# Patient Record
Sex: Female | Born: 1975 | Hispanic: No | Marital: Single | Smoking: Never smoker
Health system: Southern US, Community
[De-identification: ages and names within clinical notes are randomized; demographics above are authoritative.]

## PROBLEM LIST (undated history)

## (undated) DIAGNOSIS — T7840XA Allergy, unspecified, initial encounter: Secondary | ICD-10-CM

## (undated) HISTORY — DX: Allergy, unspecified, initial encounter: T78.40XA

---

## 2014-09-19 ENCOUNTER — Ambulatory Visit
Admission: RE | Admit: 2014-09-19 | Discharge: 2014-09-19 | Disposition: A | Discharge status: Home / Self Care | Payer: BC Managed Care – PPO | Source: Ambulatory Visit | Attending: Family Medicine | Admitting: Family Medicine

## 2014-09-19 ENCOUNTER — Other Ambulatory Visit: Payer: Self-pay | Admitting: Family Medicine

## 2014-09-19 DIAGNOSIS — R059 Cough, unspecified: Secondary | ICD-10-CM

## 2014-09-19 DIAGNOSIS — R05 Cough: Secondary | ICD-10-CM

## 2014-12-04 ENCOUNTER — Other Ambulatory Visit: Payer: Self-pay | Admitting: Family Medicine

## 2014-12-04 ENCOUNTER — Ambulatory Visit
Admission: RE | Admit: 2014-12-04 | Discharge: 2014-12-04 | Disposition: A | Discharge status: Home / Self Care | Payer: BLUE CROSS/BLUE SHIELD | Source: Ambulatory Visit | Attending: Family Medicine | Admitting: Family Medicine

## 2014-12-04 DIAGNOSIS — M542 Cervicalgia: Secondary | ICD-10-CM

## 2014-12-05 ENCOUNTER — Ambulatory Visit: Payer: BLUE CROSS/BLUE SHIELD | Attending: Family Medicine

## 2014-12-05 DIAGNOSIS — S199XXD Unspecified injury of neck, subsequent encounter: Secondary | ICD-10-CM | POA: Insufficient documentation

## 2014-12-05 DIAGNOSIS — M542 Cervicalgia: Secondary | ICD-10-CM | POA: Diagnosis not present

## 2014-12-05 DIAGNOSIS — S4992XD Unspecified injury of left shoulder and upper arm, subsequent encounter: Secondary | ICD-10-CM | POA: Insufficient documentation

## 2014-12-05 DIAGNOSIS — M25511 Pain in right shoulder: Secondary | ICD-10-CM | POA: Diagnosis not present

## 2014-12-05 DIAGNOSIS — R5381 Other malaise: Secondary | ICD-10-CM | POA: Diagnosis not present

## 2014-12-05 DIAGNOSIS — S299XXD Unspecified injury of thorax, subsequent encounter: Secondary | ICD-10-CM | POA: Diagnosis not present

## 2014-12-05 DIAGNOSIS — S4991XD Unspecified injury of right shoulder and upper arm, subsequent encounter: Secondary | ICD-10-CM | POA: Diagnosis not present

## 2014-12-05 DIAGNOSIS — M25512 Pain in left shoulder: Secondary | ICD-10-CM | POA: Diagnosis not present

## 2014-12-10 ENCOUNTER — Ambulatory Visit: Payer: BLUE CROSS/BLUE SHIELD | Admitting: Physical Therapy

## 2014-12-12 ENCOUNTER — Other Ambulatory Visit (HOSPITAL_COMMUNITY)
Admission: RE | Admit: 2014-12-12 | Discharge: 2014-12-12 | Disposition: A | Discharge status: Home / Self Care | Payer: BLUE CROSS/BLUE SHIELD | Source: Ambulatory Visit | Attending: Obstetrics and Gynecology | Admitting: Obstetrics and Gynecology

## 2014-12-12 ENCOUNTER — Other Ambulatory Visit: Payer: Self-pay | Admitting: Obstetrics and Gynecology

## 2014-12-12 DIAGNOSIS — Z1151 Encounter for screening for human papillomavirus (HPV): Secondary | ICD-10-CM | POA: Insufficient documentation

## 2014-12-12 DIAGNOSIS — Z01419 Encounter for gynecological examination (general) (routine) without abnormal findings: Secondary | ICD-10-CM | POA: Insufficient documentation

## 2014-12-13 ENCOUNTER — Ambulatory Visit: Payer: BLUE CROSS/BLUE SHIELD | Admitting: Physical Therapy

## 2014-12-13 DIAGNOSIS — S199XXD Unspecified injury of neck, subsequent encounter: Secondary | ICD-10-CM | POA: Diagnosis not present

## 2014-12-13 LAB — CYTOLOGY - PAP

## 2014-12-17 ENCOUNTER — Ambulatory Visit: Payer: BLUE CROSS/BLUE SHIELD | Admitting: Physical Therapy

## 2014-12-17 DIAGNOSIS — S199XXD Unspecified injury of neck, subsequent encounter: Secondary | ICD-10-CM | POA: Diagnosis not present

## 2014-12-19 ENCOUNTER — Ambulatory Visit: Payer: BLUE CROSS/BLUE SHIELD | Admitting: Physical Therapy

## 2014-12-19 DIAGNOSIS — S199XXD Unspecified injury of neck, subsequent encounter: Secondary | ICD-10-CM | POA: Diagnosis not present

## 2014-12-24 ENCOUNTER — Ambulatory Visit: Payer: BLUE CROSS/BLUE SHIELD | Attending: Family Medicine

## 2014-12-24 DIAGNOSIS — M25512 Pain in left shoulder: Secondary | ICD-10-CM | POA: Insufficient documentation

## 2014-12-24 DIAGNOSIS — S4991XD Unspecified injury of right shoulder and upper arm, subsequent encounter: Secondary | ICD-10-CM | POA: Diagnosis not present

## 2014-12-24 DIAGNOSIS — R5381 Other malaise: Secondary | ICD-10-CM | POA: Diagnosis not present

## 2014-12-24 DIAGNOSIS — S299XXD Unspecified injury of thorax, subsequent encounter: Secondary | ICD-10-CM | POA: Diagnosis not present

## 2014-12-24 DIAGNOSIS — M25511 Pain in right shoulder: Secondary | ICD-10-CM | POA: Insufficient documentation

## 2014-12-24 DIAGNOSIS — S4992XD Unspecified injury of left shoulder and upper arm, subsequent encounter: Secondary | ICD-10-CM | POA: Insufficient documentation

## 2014-12-24 DIAGNOSIS — S199XXD Unspecified injury of neck, subsequent encounter: Secondary | ICD-10-CM | POA: Insufficient documentation

## 2014-12-24 DIAGNOSIS — M542 Cervicalgia: Secondary | ICD-10-CM | POA: Diagnosis not present

## 2014-12-26 ENCOUNTER — Ambulatory Visit: Payer: BLUE CROSS/BLUE SHIELD | Admitting: Physical Therapy

## 2014-12-26 DIAGNOSIS — S199XXD Unspecified injury of neck, subsequent encounter: Secondary | ICD-10-CM | POA: Diagnosis not present

## 2014-12-28 ENCOUNTER — Ambulatory Visit: Payer: BLUE CROSS/BLUE SHIELD | Admitting: Physical Therapy

## 2014-12-28 DIAGNOSIS — S199XXD Unspecified injury of neck, subsequent encounter: Secondary | ICD-10-CM | POA: Diagnosis not present

## 2014-12-31 ENCOUNTER — Ambulatory Visit: Payer: BLUE CROSS/BLUE SHIELD | Admitting: Physical Therapy

## 2014-12-31 DIAGNOSIS — S199XXD Unspecified injury of neck, subsequent encounter: Secondary | ICD-10-CM | POA: Diagnosis not present

## 2015-01-02 ENCOUNTER — Ambulatory Visit: Payer: BLUE CROSS/BLUE SHIELD | Admitting: Physical Therapy

## 2015-01-02 DIAGNOSIS — S199XXD Unspecified injury of neck, subsequent encounter: Secondary | ICD-10-CM | POA: Diagnosis not present

## 2015-01-04 ENCOUNTER — Ambulatory Visit: Payer: BLUE CROSS/BLUE SHIELD | Admitting: Physical Therapy

## 2015-01-04 DIAGNOSIS — S199XXD Unspecified injury of neck, subsequent encounter: Secondary | ICD-10-CM | POA: Diagnosis not present

## 2015-01-07 ENCOUNTER — Ambulatory Visit: Payer: BLUE CROSS/BLUE SHIELD | Admitting: Physical Therapy

## 2015-01-15 ENCOUNTER — Ambulatory Visit: Payer: BLUE CROSS/BLUE SHIELD

## 2015-01-15 DIAGNOSIS — S199XXD Unspecified injury of neck, subsequent encounter: Secondary | ICD-10-CM | POA: Diagnosis not present

## 2015-01-15 DIAGNOSIS — M62838 Other muscle spasm: Secondary | ICD-10-CM

## 2015-01-15 DIAGNOSIS — M542 Cervicalgia: Secondary | ICD-10-CM

## 2015-01-15 NOTE — Therapy (Signed)
[facility] [address], [address] [place], [place], [address] Phone: [phone]   Fax:  [phone]  Physical Therapy Treatment  Patient Details  Name: Brenda Brown MRN: [id number] Date of Birth: 03/14/1976 Referring Provider:  Koirala, Dibas, MD  Encounter Date: 01/15/2015      PT End of Session - 01/15/15 1608    Visit Number 11   Date for PT Re-Evaluation 01/25/15   PT Start Time 1535   PT Stop Time 1620   PT Time Calculation (min) 45 min   Activity Tolerance Patient tolerated treatment well   Behavior During Therapy WFL for tasks assessed/performed      Past Medical History  Diagnosis Date  . Allergy     History reviewed. No pertinent past surgical history.  There were no vitals taken for this visit.  Visit Diagnosis:  Neck pain on right side  Muscle spasms of head or neck      Subjective Assessment - 01/15/15 1540    Symptoms Pt with flare-up on Saturday after driving   Currently in Pain? Yes   Pain Score 7    Pain Location Neck   Pain Orientation Right   Pain Descriptors / Indicators Aching   Pain Type Chronic pain   Pain Onset More than a month ago   Pain Frequency Constant   Aggravating Factors  working at the computer, driving, cooking   Pain Relieving Factors muscle relaxer, rest, stretching   Effect of Pain on Daily Activities Pain with work and need for mini breaks   Multiple Pain Sites No          [facility] PT Assessment - 01/15/15 0001    Assessment   Medical Diagnosis neck pain (M54.2)   Onset Date 11/15/14   Precautions   Precautions None   Balance Screen   Has the patient fallen in the past 6 months No   Has the patient had a decrease in activity level because of a fear of falling?  No   Is the patient reluctant to leave their home because of a fear of falling?  No   Home Environment   Living Enviornment Private residence   Prior Function   Level of Independence Independent  with basic ADLs   Cognition   Overall Cognitive Status Within Functional Limits for tasks assessed                  [facility] Adult PT Treatment/Exercise - 01/15/15 0001    Exercises   Exercises Neck;Shoulder   Neck Exercises: Machines for Strengthening   UBE (Upper Arm Bike) Leve 1x 6 minutes (3/3)  PT present to discuss goal progress with pt   Neck Exercises: Supine   Other Supine Exercise supine on foam roll for decompression x 5 minutes   Modalities   Modalities Electrical Stimulation;Moist Heat   Moist Heat Therapy   Number Minutes Moist Heat 15 Minutes   Moist Heat Location Other (comment)  neck- bilateral   Electrical Stimulation   Electrical Stimulation Location neck   Electrical Stimulation Action interferential   Electrical Stimulation Parameters 15 minutes   Electrical Stimulation Goals Pain   Manual Therapy   Manual Therapy Other (comment)   Other Manual Therapy Soft tissue elongation with triggeer point release to Rt UT and cervical parapspinals   Neck Exercises: Stretches   Upper Trapezius Stretch 3 reps;20 seconds                       PT Long Term Goals - 01/15/15 1545    PT LONG TERM GOAL #1   Title demonstrate understanding and demo correct posture   Time 8   Period Weeks   Status Achieved   PT LONG TERM GOAL #2   Title be independent with advanced HEP   Time 8   Period Weeks   Status On-going  Met with current HEP   PT LONG TERM GOAL #3   Title reduce FOTO to < or = to 28% limitation   Time 8   Period Weeks   Status On-going   PT LONG TERM GOAL #4   Title report a 70% reduction in neck pain with driving   Time 8   Period Weeks   Status On-going  20-30% reduction reported this week   PT LONG TERM GOAL #5   Title report < or = to 3-4/10 neck pain at the end of the day   Time 8   Period Weeks   Status On-going  3-4/10 reported last week.  Flare-up this week now 7/10               Plan - 01/15/15 1609    Clinical  Impression Statement Pt with flare-up with driving on Saturday so limited progress toward goals this week.  See goal assessment.     Pt will benefit from skilled therapeutic intervention in order to improve on the following deficits Improper body mechanics;Decreased activity tolerance;Increased muscle spasms;Pain   Rehab Potential Good   PT Frequency 2x / week   PT Duration 8 weeks   PT Treatment/Interventions ADLs/Self Care Home Management;Moist Heat;Therapeutic activities;Patient/family education;Therapeutic exercise;Traction;Ultrasound;Manual techniques;Cryotherapy;Neuromuscular re-education;Electrical Stimulation   PT Next Visit Plan Return to exercise as able.  Continue to address Rt UT/neck pain with modalities and manual PRN.  Renewal needed next week.   Consulted and Agree with Plan of Care Patient        Problem List There are no active problems to display for this patient.   TAKACS,KELLY, PT 01/15/2015, 4:13 PM  [facility] [address], [address] [place], [place], [address] Phone: [phone]   Fax:  [phone]      

## 2015-01-17 ENCOUNTER — Ambulatory Visit: Payer: BLUE CROSS/BLUE SHIELD

## 2015-01-17 DIAGNOSIS — M62838 Other muscle spasm: Secondary | ICD-10-CM

## 2015-01-17 DIAGNOSIS — M542 Cervicalgia: Secondary | ICD-10-CM

## 2015-01-17 DIAGNOSIS — S199XXD Unspecified injury of neck, subsequent encounter: Secondary | ICD-10-CM | POA: Diagnosis not present

## 2015-01-17 NOTE — Therapy (Signed)
[facility] [address], [address] [place], [place], [address] Phone: [phone]   Fax:  [phone]  Physical Therapy Treatment  Patient Details  Name: Brenda Brown MRN: [id number] Date of Birth: 04/30/1976 Referring Provider:  Koirala, Dibas, MD  Encounter Date: 01/17/2015      PT End of Session - 01/17/15 1609    Visit Number 12   Date for PT Re-Evaluation 01/25/15   PT Start Time 1541   PT Stop Time 1625   PT Time Calculation (min) 44 min   Activity Tolerance Patient tolerated treatment well   Behavior During Therapy WFL for tasks assessed/performed      Past Medical History  Diagnosis Date  . Allergy     History reviewed. No pertinent past surgical history.  There were no vitals taken for this visit.  Visit Diagnosis:  Neck pain on right side  Muscle spasms of head or neck      Subjective Assessment - 01/17/15 1540    Symptoms Neck is feeling better today.  Felt improvement after last treatment.   Currently in Pain? Yes   Pain Score 3    Pain Location Neck   Pain Orientation Right   Pain Descriptors / Indicators Sore   Multiple Pain Sites No          [facility] PT Assessment - 01/17/15 0001    Assessment   Medical Diagnosis neck pain (M54.2)   Onset Date 11/15/14                  [facility] Adult PT Treatment/Exercise - 01/17/15 0001    Neck Exercises: Machines for Strengthening   UBE (Upper Arm Bike) Leve 1x 6 minutes (3/3)  PT present to discuss pain with pt.   Neck Exercises: Supine   Other Supine Exercise supine on foam roll for decompression x 5 minutes   Neck Exercises: Prone   Other Prone Exercise cervcial rotation/diagonals x 10 each direction   Shoulder Exercises: Supine   Horizontal ABduction Strengthening;Theraband;20 reps  performed supine on a foam roll   Theraband Level (Shoulder Horizontal ABduction) Level 2 (Red)   Electrical Stimulation   Electrical Stimulation  Location neck   Electrical Stimulation Action interferential   Electrical Stimulation Parameters 15 minutes   Electrical Stimulation Goals Pain   Manual Therapy   Manual Therapy Myofascial release;Other (comment)   Myofascial Release Soft tissue release to Rt cervical paraspinals and UT   Other Manual Therapy soft tissue elongation to subocciptinals, UT and cervical paraspinals                     PT Long Term Goals - 01/15/15 1545    PT LONG TERM GOAL #1   Title demonstrate understanding and demo correct posture   Time 8   Period Weeks   Status Achieved   PT LONG TERM GOAL #2   Title be independent with advanced HEP   Time 8   Period Weeks   Status On-going  Met with current HEP   PT LONG TERM GOAL #3   Title reduce FOTO to < or = to 28% limitation   Time 8   Period Weeks   Status On-going   PT LONG TERM GOAL #4   Title report a 70% reduction in neck pain with driving   Time 8   Period Weeks   Status On-going  20-30% reduction reported this week   PT LONG TERM GOAL #5   Title   report < or = to 3-4/10 neck pain at the end of the day   Time 8   Period Weeks   Status On-going  3-4/10 reported last week.  Flare-up this week now 7/10               Plan - 01/17/15 1610    Clinical Impression Statement Pt with increased tolerance for exercise today and reduced muscle spasm on the Rt.     Pt will benefit from skilled therapeutic intervention in order to improve on the following deficits Improper body mechanics;Decreased activity tolerance;Increased muscle spasms;Pain   Rehab Potential Good   PT Frequency 2x / week   PT Duration 8 weeks   PT Treatment/Interventions ADLs/Self Care Home Management;Moist Heat;Therapeutic activities;Patient/family education;Therapeutic exercise;Traction;Ultrasound;Manual techniques;Cryotherapy;Neuromuscular re-education;Electrical Stimulation   PT Next Visit Plan Cervical flexibility, strength, manual and modalities for Rt  UT/neck.  Renewal needed next week.   PT Home Exercise Plan Add theraband to HEP- horizonal abduction    Consulted and Agree with Plan of Care Patient        Problem List There are no active problems to display for this patient.   TAKACS,KELLY, PT 01/17/2015, 4:14 PM  [facility] [address], [address] [place], [place], [address] Phone: [phone]   Fax:  [phone]      

## 2015-01-22 ENCOUNTER — Ambulatory Visit: Payer: BLUE CROSS/BLUE SHIELD | Attending: Family Medicine | Admitting: Physical Therapy

## 2015-01-22 ENCOUNTER — Encounter: Payer: Self-pay | Admitting: Physical Therapy

## 2015-01-22 DIAGNOSIS — R5381 Other malaise: Secondary | ICD-10-CM | POA: Diagnosis not present

## 2015-01-22 DIAGNOSIS — S199XXD Unspecified injury of neck, subsequent encounter: Secondary | ICD-10-CM | POA: Diagnosis present

## 2015-01-22 DIAGNOSIS — M25512 Pain in left shoulder: Secondary | ICD-10-CM | POA: Insufficient documentation

## 2015-01-22 DIAGNOSIS — M62838 Other muscle spasm: Secondary | ICD-10-CM

## 2015-01-22 DIAGNOSIS — M25511 Pain in right shoulder: Secondary | ICD-10-CM | POA: Insufficient documentation

## 2015-01-22 DIAGNOSIS — S4992XD Unspecified injury of left shoulder and upper arm, subsequent encounter: Secondary | ICD-10-CM | POA: Diagnosis not present

## 2015-01-22 DIAGNOSIS — S299XXD Unspecified injury of thorax, subsequent encounter: Secondary | ICD-10-CM | POA: Insufficient documentation

## 2015-01-22 DIAGNOSIS — S4991XD Unspecified injury of right shoulder and upper arm, subsequent encounter: Secondary | ICD-10-CM | POA: Diagnosis not present

## 2015-01-22 DIAGNOSIS — M542 Cervicalgia: Secondary | ICD-10-CM | POA: Diagnosis not present

## 2015-01-22 NOTE — Therapy (Signed)
[facility] [address], [place], [place], [address] Phone: [phone]   Fax:  [phone]  Physical Therapy Treatment  Patient Details  Name: Brenda Brown MRN: [id number] Date of Birth: 1976/09/29 Referring Provider:  Lujean Amel, MD  Encounter Date: 01/22/2015      PT End of Session - 01/22/15 1655    Visit Number 13   Date for PT Re-Evaluation 01/25/15   PT Start Time 1615   PT Stop Time 1709   PT Time Calculation (min) 54 min   Activity Tolerance Patient tolerated treatment well   Behavior During Therapy [facility] for tasks assessed/performed      Past Medical History  Diagnosis Date  . Allergy     History reviewed. No pertinent past surgical history.  There were no vitals taken for this visit.  Visit Diagnosis:  Neck pain on right side  Muscle spasms of head or neck      Subjective Assessment - 01/22/15 1626    Symptoms Neck feels better, but in the evening it is diffiicult to find comfortable position   Currently in Pain? Yes   Pain Score 1    Pain Location Nare   Pain Orientation Right   Pain Descriptors / Indicators Sore   Pain Onset More than a month ago   Pain Frequency Constant   Aggravating Factors  working at the computer, drivifng, cooking   Pain Relieving Factors muscle relaxer, reest, stretching   Effect of Pain on Daily Activities pain with worki and need for mini breaks   Multiple Pain Sites No                    [facility] Adult PT Treatment/Exercise - 01/22/15 0001    Neck Exercises: Machines for Strengthening   UBE (Upper Arm Bike) Leve 1x 6 minutes (3/3)  PT present to discuss pain with pt.   Neck Exercises: Supine   Other Supine Exercise supine on foam roll for decompression x 5 minutes   Shoulder Exercises: Supine   Horizontal ABduction Strengthening;Theraband;20 reps  performed supine on a foam roll   Theraband Level (Shoulder Horizontal ABduction) Level 2  (Red)   Modalities   Modalities Electrical Stimulation   Moist Heat Therapy   Number Minutes Moist Heat 15 Minutes   Moist Heat Location --  Neck   Electrical Stimulation   Electrical Stimulation Location neck   Electrical Stimulation Action IFC   Electrical Stimulation Parameters 80-_0    Electrical Stimulation Goals Pain   Manual Therapy   Manual Therapy Myofascial release;Other (comment)   Myofascial Release Soft tissue release to Rt cervical paraspinals and UT   Other Manual Therapy Soft tissue Work                     PT Long Term Goals - 01/15/15 1545    PT LONG TERM GOAL #1   Title demonstrate understanding and demo correct posture   Time 8   Period Weeks   Status Achieved   PT LONG TERM GOAL #2   Title be independent with advanced HEP   Time 8   Period Weeks   Status On-going  Met with current HEP   PT LONG TERM GOAL #3   Title reduce FOTO to < or = to 28% limitation   Time 8   Period Weeks   Status On-going   PT LONG TERM GOAL #4   Title report a 70% reduction in neck pain  with driving   Time 8   Period Weeks   Status On-going  20-30% reduction reported this week   PT LONG TERM GOAL #5   Title report < or = to 3-4/10 neck pain at the end of the day   Time 8   Period Weeks   Status On-going  3-4/10 reported last week.  Flare-up this week now 7/10               Plan - 01/22/15 1657    Clinical Impression Statement Pt with less pain and reduced muscle spasm on the Rt side of neck   Pt will benefit from skilled therapeutic intervention in order to improve on the following deficits Improper body mechanics;Decreased activity tolerance;Increased muscle spasms;Pain   Rehab Potential Good   PT Frequency 2x / week   PT Duration 8 weeks   PT Treatment/Interventions ADLs/Self Care Home Management;Moist Heat;Therapeutic activities;Patient/family education;Therapeutic exercise;Traction;Ultrasound;Manual techniques;Cryotherapy;Neuromuscular  re-education;Electrical Stimulation   PT Next Visit Plan Take ROM measurment for Renewal   PT Home Exercise Plan Add theraband to HEP- horizonal abduction    Consulted and Agree with Plan of Care Patient        Problem List There are no active problems to display for this patient.   NAUMANN-HOUEGNIFIO,Dearra Myhand PTA  01/22/2015, 5:01 PM  [facility] [address], [place], [place], [address] Phone: [phone]   Fax:  [phone]

## 2015-01-24 ENCOUNTER — Ambulatory Visit: Payer: BLUE CROSS/BLUE SHIELD | Admitting: Physical Therapy

## 2015-01-24 DIAGNOSIS — S199XXD Unspecified injury of neck, subsequent encounter: Secondary | ICD-10-CM | POA: Diagnosis not present

## 2015-01-24 DIAGNOSIS — M542 Cervicalgia: Secondary | ICD-10-CM

## 2015-01-24 DIAGNOSIS — M62838 Other muscle spasm: Secondary | ICD-10-CM

## 2015-01-24 NOTE — Therapy (Signed)
[facility] [address], [address] [place], [place], [address] Phone: [phone]   Fax:  [phone]  Physical Therapy Treatment  Patient Details  Name: Brenda Brown MRN: [id number] Date of Birth: 02-11-1976 Referring Provider:  Darrow BussingKoirala, Dibas, MD  Encounter Date: 01/24/2015      PT End of Session - 01/24/15 1625    Visit Number 14   Date for PT Re-Evaluation 02/22/15   PT Start Time 1622   PT Stop Time 1710   PT Time Calculation (min) 48 min   Activity Tolerance Patient tolerated treatment well   Behavior During Therapy [facility] for tasks assessed/performed      Past Medical History  Diagnosis Date  . Allergy     No past surgical history on file.  There were no vitals taken for this visit.  Visit Diagnosis:  Neck pain on right side - Plan: PT plan of care cert/re-cert  Muscle spasms of head or neck - Plan: PT plan of care cert/re-cert      Subjective Assessment - 01/24/15 1627    Symptoms Neck pain, increases with prolonged sitting at work, driving, and triggered by stress    Limitations Sitting   How long can you sit comfortably? 40 minutes, than needs to mchange position   How long can you stand comfortably? difficult to prepare meals due increasing stiffnes in neck and upper back area   Pain Score 3    Pain Location Neck   Pain Orientation Right   Pain Descriptors / Indicators Tightness;Sore   Pain Type Chronic pain   Pain Onset More than a month ago   Pain Frequency Constant   Aggravating Factors  working at the computer, driving, prparing meals   Pain Relieving Factors muscle relaxer, rest, stretching   Effect of Pain on Daily Activities limited in quantity of cooking                            PT Education - 01/25/15 0803    Education provided No             PT Long Term Goals - 01/24/15 1714    PT LONG TERM GOAL #3   Time 8   Period Weeks                Plan - 01/24/15 1652    Clinical Impression Statement continue physical therapy to work on pain, endurance of muscle to reduce fatique, and cook without limitations   Pt will benefit from skilled therapeutic intervention in order to improve on the following deficits Improper body mechanics;Decreased activity tolerance;Increased muscle spasms;Pain;Decreased mobility;Decreased range of motion;Increased fascial restricitons   Rehab Potential Good   PT Frequency 2x / week   PT Duration 4 weeks   PT Treatment/Interventions ADLs/Self Care Home Management;Moist Heat;Therapeutic activities;Patient/family education;Therapeutic exercise;Traction;Ultrasound;Manual techniques;Cryotherapy;Neuromuscular re-education;Electrical Stimulation   PT Next Visit Plan soft tissue work, strength, and flexibility   Consulted and Agree with Plan of Care Patient        Problem List There are no active problems to display for this patient.  Eulis Fosterheryl Gray, PT 01/25/2015 8:05 AM  GRAY,CHERYL, PT 01/25/2015, 8:05 AM Desiree HaneElke Naumann Houegnifio PTA [facility] [address], [address] [place], [place], [address] Phone: [phone]   Fax:  [phone]

## 2015-01-29 ENCOUNTER — Ambulatory Visit: Payer: BLUE CROSS/BLUE SHIELD | Admitting: Physical Therapy

## 2015-01-29 ENCOUNTER — Encounter: Payer: Self-pay | Admitting: Physical Therapy

## 2015-01-29 DIAGNOSIS — S199XXD Unspecified injury of neck, subsequent encounter: Secondary | ICD-10-CM | POA: Diagnosis not present

## 2015-01-29 DIAGNOSIS — M62838 Other muscle spasm: Secondary | ICD-10-CM

## 2015-01-29 DIAGNOSIS — M542 Cervicalgia: Secondary | ICD-10-CM

## 2015-01-29 NOTE — Patient Instructions (Signed)
Neck: Retraction   Sit with back and head straight. Pull chin back to line up ear with shoulder. Do not turn or tilt head. May assist if child cannot keep correct position. Hold _5___ seconds. Repeat _10___ times. Do _2-3___ sessions per day. CAUTION: Movement should be gentle, steady and slow.  Copyright  VHI. All rights reserved.

## 2015-01-29 NOTE — Therapy (Signed)
[facility] [address], [address] [place], [place], [address] Phone: [phone]   Fax:  [phone]  Physical Therapy Treatment  Patient Details  Name: Brenda CanavanKavitha Butrum MRN: [id number] Date of Birth: [birth year] Referring Provider:  Darrow BussingKoirala, Dibas, MD  Encounter Date: 01/29/2015      PT End of Session - 01/29/15 1257    Visit Number 15   Date for PT Re-Evaluation 02/22/15   PT Start Time 1235   PT Stop Time 1333   PT Time Calculation (min) 58 min   Activity Tolerance Patient tolerated treatment well   Behavior During Therapy [facility] for tasks assessed/performed      Past Medical History  Diagnosis Date  . Allergy     History reviewed. No pertinent past surgical history.  There were no vitals taken for this visit.  Visit Diagnosis:  Neck pain on right side  Muscle spasms of head or neck      Subjective Assessment - 01/29/15 1237    Symptoms Always mild discomfort in upper cervical paraspinals, after prolonged [facility] or driving   Limitations Sitting;Other (comment)  driving < 20min    How long can you sit comfortably? 40 minutes, than needs to mchange position   How long can you stand comfortably? still difficult to prepare food with extended periods of stearing   Currently in Pain? Yes   Pain Score 2    Pain Location Neck   Pain Orientation Right   Pain Descriptors / Indicators Sore;Tightness   Pain Type Chronic pain   Pain Onset More than a month ago   Pain Frequency Constant   Aggravating Factors  working at the computer, driving, preparing meals   Pain Relieving Factors muscle relaxer, rest, stretching   Effect of Pain on Daily Activities needs to change postitions more frequently,    Multiple Pain Sites No                    [facility] Adult PT Treatment/Exercise - 01/29/15 0001    Neck Exercises: Machines for Strengthening   UBE (Upper Arm Bike) Leve 2x 6 minutes (3/3)  PT present to  discuss pain with pt.   Neck Exercises: Supine   Neck Retraction 10 reps;5 secs  on Foam roll   Cervical Rotation Both;10 reps  with B UE in ER   Cervical Rotation Limitations discomfort at end range   Other Supine Exercise Foam Roll Melt method: flex rot Lt & Rt x 10    Shoulder Exercises: Supine   Horizontal ABduction Strengthening;Theraband;20 reps  performed supine on a foam roll   Theraband Level (Shoulder Horizontal ABduction) Level 2 (Red)   Modalities   Modalities Electrical Stimulation   Moist Heat Therapy   Number Minutes Moist Heat 15 Minutes   Moist Heat Location Other (comment)  Neck   Electrical Stimulation   Electrical Stimulation Location neck   Electrical Stimulation Action IFC   Electrical Stimulation Parameters 80-150Hz    Electrical Stimulation Goals Pain   Manual Therapy   Manual Therapy Myofascial release   Myofascial Release Soft tissue release to Rt cervical paraspinals and UT   Other Manual Therapy Soft tissue Work                PT Education - 01/29/15 1324    Education provided Yes   Education Details cerical retraction   Person(s) Educated Patient   Methods Explanation;Verbal cues;Handout   Comprehension Returned demonstration;Verbalized understanding  PT Long Term Goals - 01/24/15 1714    PT LONG TERM GOAL #3   Time 8   Period Weeks               Plan - 01/29/15 1259    Clinical Impression Statement continue with PT to imrpve end Range of motion without discomfort, endurance of muscle to reduce fatique   Pt will benefit from skilled therapeutic intervention in order to improve on the following deficits Improper body mechanics;Decreased activity tolerance;Increased muscle spasms;Pain;Decreased mobility;Decreased range of motion;Increased fascial restricitons   Rehab Potential Good   PT Frequency 2x / week   PT Duration 4 weeks   PT Next Visit Plan Continue strength and flexibility, soft tissue work   PT  Home Exercise Plan Add theraband to HEP- horizonal abduction    Consulted and Agree with Plan of Care Patient        Problem List There are no active problems to display for this patient.   NAUMANN-HOUEGNIFIO,Leanor Voris PTA 01/29/2015, 1:29 PM  [facility] [address], [address] [place], [place], [address] Phone: [phone]   Fax:  [phone]

## 2015-01-31 ENCOUNTER — Ambulatory Visit: Payer: BLUE CROSS/BLUE SHIELD

## 2015-01-31 DIAGNOSIS — M542 Cervicalgia: Secondary | ICD-10-CM

## 2015-01-31 DIAGNOSIS — S199XXD Unspecified injury of neck, subsequent encounter: Secondary | ICD-10-CM | POA: Diagnosis not present

## 2015-01-31 DIAGNOSIS — M62838 Other muscle spasm: Secondary | ICD-10-CM

## 2015-01-31 NOTE — Therapy (Signed)
[facility] [address] [place], [place], [address] Phone: [phone]   Fax:  [phone]  Physical Therapy Treatment  Patient Details  Name: Brenda Brown MRN: [id number] Date of Birth: 1976/03/30 Referring Provider:  Lujean Amel, MD  Encounter Date: 01/31/2015      PT End of Session - 01/31/15 1041    Visit Number 15   Date for PT Re-Evaluation 02/22/15   PT Start Time 1017   PT Stop Time 1059   PT Time Calculation (min) 42 min   Activity Tolerance Patient tolerated treatment well   Behavior During Therapy [facility] for tasks assessed/performed      Past Medical History  Diagnosis Date  . Allergy     History reviewed. No pertinent past surgical history.  There were no vitals taken for this visit.  Visit Diagnosis:  Neck pain, bilateral  Muscle spasms of head or neck      Subjective Assessment - 01/31/15 1018    Symptoms Flare-up of pain after last session.  Not able to rotate head secondary to pain.     Pain Score 5    Pain Location Neck   Pain Orientation Right;Left  Rt>Lt   Pain Descriptors / Indicators Sore;Aching   Pain Type Chronic pain   Multiple Pain Sites No                    [facility] Adult PT Treatment/Exercise - 01/31/15 0001    Modalities   Modalities Electrical Stimulation;Ultrasound   Moist Heat Therapy   Number Minutes Moist Heat 25 Minutes   Moist Heat Location Other (comment)  neck   Electrical Stimulation   Electrical Stimulation Location neck   Electrical Stimulation Action IFC   Electrical Stimulation Parameters 25 minutes   Electrical Stimulation Goals Pain   Ultrasound   Ultrasound Location bilateral neck   Ultrasound Parameters 1.3 w/cm continuous x 8 minutes   Ultrasound Goals Pain                PT Education - 01/31/15 1038    Education Details importance of correct posture, back against the wall exercise   Person(s) Educated Patient   Methods Explanation;Demonstration;Handout   Comprehension Verbalized understanding             PT Long Term Goals - 01/31/15 1045    PT LONG TERM GOAL #2   Title be independent with advanced HEP   Time 8   Status On-going  independent in current program.  Limited compliance due to flare-up   PT LONG TERM GOAL #4   Title report a 70% reduction in neck pain with driving   Status On-going  Flare-up of neck pain and limited driving this week               Plan - 01/31/15 1042    Clinical Impression Statement Pt with limited ability to perform exercise secondary to flare-up of pain over the past 2 days.  Modalities today to reduce pain and relax cervical muscles.  Pt with poor seated posture today.  No new goals met secondary to increaed pain this week.   Pt will benefit from skilled therapeutic intervention in order to improve on the following deficits Improper body mechanics;Decreased activity tolerance;Increased muscle spasms;Pain;Decreased mobility;Decreased range of motion;Increased fascial restricitons   Rehab Potential Good   PT Frequency 2x / week   PT Duration 4 weeks   PT Treatment/Interventions ADLs/Self Care Home Management;Moist Heat;Therapeutic activities;Patient/family education;Therapeutic  exercise;Traction;Ultrasound;Manual techniques;Cryotherapy;Neuromuscular re-education;Electrical Stimulation   PT Next Visit Plan Resume activity as able.  Modalities and manual as needed.   Consulted and Agree with Plan of Care Patient        Problem List There are no active problems to display for this patient.   TAKACS,KELLY, PT 01/31/2015, 10:48 AM  [facility] [address] [place], [place], [address] Phone: [phone]   Fax:  [phone]

## 2015-01-31 NOTE — Patient Instructions (Signed)
Posture Tips DO: - stand tall and erect - keep chin tucked in - keep head and shoulders in alignment - check posture regularly in mirror or large window - pull head back against headrest in car seat;  Change your position often.  Sit with lumbar support. DON'T: - slouch or slump while watching TV or reading - sit, stand or lie in one position  for too long;  Sitting is especially hard on the spine so if you sit at a desk/use the computer, then stand up often!   Copyright  VHI. All rights reserved.  Posture - Standing   Good posture is important. Avoid slouching and forward head thrust. Maintain curve in low back and align ears over shoul- ders, hips over ankles.  Pull your belly button in toward your back bone  Stand with back against the wall: heels, buttock, shoulders and back of the head touching.  Goal: 5 minutes each day.  Copyright  VHI. All rights reserved.  Posture - Sitting   Sit upright, head facing forward. Try using a roll to support lower back. Keep shoulders relaxed, and avoid rounded back. Keep hips level with knees. Avoid crossing legs for long periods.   Copyright  VHI. All rights reserved.

## 2015-02-04 ENCOUNTER — Encounter: Payer: Self-pay | Admitting: Physical Therapy

## 2015-02-04 ENCOUNTER — Ambulatory Visit: Payer: BLUE CROSS/BLUE SHIELD | Admitting: Physical Therapy

## 2015-02-04 DIAGNOSIS — M62838 Other muscle spasm: Secondary | ICD-10-CM

## 2015-02-04 DIAGNOSIS — M542 Cervicalgia: Secondary | ICD-10-CM

## 2015-02-04 DIAGNOSIS — S199XXD Unspecified injury of neck, subsequent encounter: Secondary | ICD-10-CM | POA: Diagnosis not present

## 2015-02-04 NOTE — Therapy (Signed)
[facility] [address] [place], [place], [address] Phone: [phone]   Fax:  [phone]  Physical Therapy Treatment  Patient Details  Name: Brenda Brown MRN: [id number] Date of Birth: [birth year] Referring Provider:  Darrow BussingKoirala, Dibas, MD  Encounter Date: 02/04/2015      PT End of Session - 02/04/15 1353    Visit Number 16   Date for PT Re-Evaluation 02/22/15   PT Start Time 0940  pt arrived late   PT Stop Time 1032   PT Time Calculation (min) 52 min   Activity Tolerance Patient tolerated treatment well   Behavior During Therapy [facility] for tasks assessed/performed      Past Medical History  Diagnosis Date  . Allergy     History reviewed. No pertinent past surgical history.  There were no vitals filed for this visit.  Visit Diagnosis:  Neck pain, bilateral  Muscle spasms of head or neck  Neck pain on right side      Subjective Assessment - 02/04/15 0943    Symptoms Pt reports 40% better since last week. With improved ROM into rotation, but still limited due to pain and stiffness upper cervical spine   Limitations Sitting   How long can you sit comfortably? 50 -60 minutes, than need to change position   How long can you stand comfortably? still difficult to prepare food with extended periods of stearing   Currently in Pain? Yes   Pain Score 3    Pain Location Neck   Pain Orientation Right;Left   Pain Type Chronic pain   Pain Onset More than a month ago   Pain Frequency Constant   Aggravating Factors  working at the computer, driving, preparing meals   Pain Relieving Factors muscle relaxer, rst, exercises on foam roll at home   Effect of Pain on Daily Activities needs to change positions more frequently   Multiple Pain Sites No                       [facility] Adult PT Treatment/Exercise - 02/04/15 0001    Neck Exercises: Machines for Strengthening   UBE (Upper Arm Bike) L1 x796min   PTA present to discuss progress   Neck Exercises: Supine   Neck Retraction 20 reps  on faom roll   Shoulder Exercises: Supine   Horizontal ABduction Strengthening  with red-t-band on foam roll    Theraband Level (Shoulder Horizontal ABduction) Level 2 (Red)   Flexion AROM;20 reps;10 reps  with red t-band, with constant pull for multifidius strength   Theraband Level (Shoulder Flexion) Level 2 (Red)   Modalities   Modalities Electrical Stimulation   Moist Heat Therapy   Number Minutes Moist Heat 15 Minutes   Moist Heat Location --  neck   Electrical Stimulation   Electrical Stimulation Location neck   Electrical Stimulation Action IFC   Electrical Stimulation Parameters 80-150Hz    Electrical Stimulation Goals Pain   Ultrasound   Ultrasound Location bil neck   Ultrasound Parameters 1.3 W/C   Ultrasound Goals Pain                     PT Long Term Goals - 02/04/15 1014    PT LONG TERM GOAL #1   Title demonstrate understanding and demo correct posture   Time 8   Period Weeks   Status Achieved   PT LONG TERM GOAL #2   Title be independent with advanced HEP  Time 8   Period Weeks   Status On-going   PT LONG TERM GOAL #3   Title reduce FOTO to < or = to 28% limitation   Time 8   Period Weeks   Status On-going   PT LONG TERM GOAL #4   Title report a 70% reduction in neck pain with driving  81% reduction progressing toward goal   Time 8   Period Weeks   Status On-going   PT LONG TERM GOAL #5   Title report < or = to 3-4/10 neck pain at the end of the day   Time 8   Period Weeks   Status On-going               Plan - 02/04/15 0958    Clinical Impression Statement pt able to perform exercises at home and using her personal foam roll   Pt will benefit from skilled therapeutic intervention in order to improve on the following deficits Improper body mechanics;Decreased activity tolerance;Increased muscle spasms;Pain;Decreased mobility;Decreased  range of motion;Increased fascial restricitons   Rehab Potential Good   PT Frequency 2x / week   PT Duration 4 weeks   PT Next Visit Plan Continue with endurance and strength, posture awarness   Consulted and Agree with Plan of Care Patient        Problem List There are no active problems to display for this patient.   NAUMANN-HOUEGNIFIO,Matisyn Cabeza PTA 02/04/2015, 1:56 PM  [place] [address] [place], [place], [address] Phone: [phone]   Fax:  [phone]

## 2015-02-06 ENCOUNTER — Encounter: Payer: Self-pay | Admitting: Physical Therapy

## 2015-02-06 ENCOUNTER — Ambulatory Visit: Payer: BLUE CROSS/BLUE SHIELD | Admitting: Physical Therapy

## 2015-02-06 DIAGNOSIS — M62838 Other muscle spasm: Secondary | ICD-10-CM

## 2015-02-06 DIAGNOSIS — S199XXD Unspecified injury of neck, subsequent encounter: Secondary | ICD-10-CM | POA: Diagnosis not present

## 2015-02-06 DIAGNOSIS — M542 Cervicalgia: Secondary | ICD-10-CM

## 2015-02-06 NOTE — Therapy (Signed)
[facility] [address] [place], [place], [address] Phone: [phone]   Fax:  [phone]  Physical Therapy Treatment  Patient Details  Name: Brenda Brown MRN: [id number] Date of Birth: [birth year] Referring Provider:  Lujean Amel, MD  Encounter Date: 02/06/2015      PT End of Session - 02/06/15 0951    Visit Number 17   Date for PT Re-Evaluation 02/22/15   PT Start Time 0932   PT Stop Time 1031   PT Time Calculation (min) 59 min   Activity Tolerance Patient tolerated treatment well   Behavior During Therapy [facility] for tasks assessed/performed      Past Medical History  Diagnosis Date  . Allergy     History reviewed. No pertinent past surgical history.  There were no vitals filed for this visit.  Visit Diagnosis:  Neck pain, bilateral  Muscle spasms of head or neck  Neck pain on right side      Subjective Assessment - 02/06/15 0935    Symptoms Pt reports 60-70% better this week. Cervical rotation is improving as noted while driving, toward the end of day Rt upper cervical stiff   Limitations Sitting  long distance driving more than 38VFI   How long can you sit comfortably? 60 minutes, than need to change position   How long can you stand comfortably? still difficult to prepare food with extended periods of stearing   Currently in Pain? Yes   Pain Score 3    Pain Location Neck   Pain Orientation Right;Left   Pain Descriptors / Indicators Aching;Sore   Pain Type Chronic pain   Pain Onset More than a month ago   Pain Frequency Intermittent   Aggravating Factors  working at the computer, driving, preparing meals   Pain Relieving Factors muscle relaxers as needed, rest, exercises on foam roll at home   Effect of Pain on Daily Activities needs to change positions more frequently   Multiple Pain Sites No                       [facility] Adult PT Treatment/Exercise - 02/06/15 0001    Neck Exercises: Machines for Strengthening   UBE (Upper Arm Bike) L1 x20mn  PTA present to discuss progress   Neck Exercises: Seated   Neck Retraction 20 reps;3 secs   Shoulder Exercises: Supine   Horizontal ABduction Strengthening  with red-t-band on foam roll    Theraband Level (Shoulder Horizontal ABduction) Level 2 (Red)   Flexion AROM;20 reps;10 reps  with red t-band, with constant pull for multifidius strength   Theraband Level (Shoulder Flexion) Level 2 (Red)   Shoulder Exercises: [employer]20 reps  25#   Modalities   Modalities Electrical Stimulation   Moist Heat Therapy   Number Minutes Moist Heat 15 Minutes   Moist Heat Location Other (comment)  Neck   Electrical Stimulation   Electrical Stimulation Location Neck   Electrical Stimulation Action IFC   Electrical Stimulation Parameters 80-150Hz    Electrical Stimulation Goals Pain   Ultrasound   Ultrasound Location bil neck   Ultrasound Parameters 1.3 W/cm   Ultrasound Goals Pain                     PT Long Term Goals - 02/06/15 0947    PT LONG TERM GOAL #1   Title demonstrate understanding and demo correct posture   Time 8   Period  Weeks   Status Achieved   PT LONG TERM GOAL #2   Title be independent with advanced HEP   Time 8   Period Weeks   Status On-going   PT LONG TERM GOAL #3   Title reduce FOTO to < or = to 28% limitation   Time 8   Period Weeks   Status On-going   PT LONG TERM GOAL #4   Title report a 70% reduction in neck pain with driving  reports 22% improvement   Time 8   Period Weeks   Status Partially Met   PT LONG TERM GOAL #5   Title report < or = to 3-4/10 neck pain at the end of the day   Time 8   Period Weeks   Status Achieved               Plan - 02/06/15 4825    Clinical Impression Statement Pt reports able to tolerate sitting longer in front of PC and cervical rotation improved   Rehab Potential Good   PT Frequency 2x / week   PT Duration 4  weeks   PT Treatment/Interventions ADLs/Self Care Home Management;Moist Heat;Therapeutic activities;Patient/family education;Therapeutic exercise;Traction;Ultrasound;Manual techniques;Cryotherapy;Neuromuscular re-education;Electrical Stimulation   PT Next Visit Plan Continue with endurance and strength, posture awarness   PT Home Exercise Plan Add theraband to HEP- horizonal abduction    Consulted and Agree with Plan of Care Patient        Problem List There are no active problems to display for this patient.   NAUMANN-HOUEGNIFIO,Hailynn Slovacek PTA 02/06/2015, 2:16 PM  [place] [address] [place], [place], [address] Phone: [phone]   Fax:  [phone]

## 2015-02-13 ENCOUNTER — Ambulatory Visit: Payer: BLUE CROSS/BLUE SHIELD

## 2015-02-13 DIAGNOSIS — S199XXD Unspecified injury of neck, subsequent encounter: Secondary | ICD-10-CM | POA: Diagnosis not present

## 2015-02-13 DIAGNOSIS — M62838 Other muscle spasm: Secondary | ICD-10-CM

## 2015-02-13 DIAGNOSIS — M542 Cervicalgia: Secondary | ICD-10-CM

## 2015-02-13 NOTE — Therapy (Signed)
[facility] [address] [place], [place], [address] Phone: [phone]   Fax:  [phone]  Physical Therapy Treatment  Patient Details  Name: Brenda Brown MRN: [id number] Date of Birth: [birth year] Referring Provider:  Darrow Bussing, MD  Encounter Date: 02/13/2015      PT End of Session - 02/13/15 1011    Visit Number 18   Date for PT Re-Evaluation 02/22/15   PT Start Time 0939   PT Stop Time 1025   PT Time Calculation (min) 46 min   Activity Tolerance Patient tolerated treatment well   Behavior During Therapy [facility] for tasks assessed/performed      Past Medical History  Diagnosis Date  . Allergy     History reviewed. No pertinent past surgical history.  There were no vitals filed for this visit.  Visit Diagnosis:  Neck pain, bilateral  Muscle spasms of head or neck  Neck pain on right side      Subjective Assessment - 02/13/15 0942    Symptoms Pt had flare-up of neck pain over the weekend secondary to need to sit for an extended period of time.     Currently in Pain? Yes   Pain Score 5    Pain Location Neck   Pain Orientation Right;Left   Pain Descriptors / Indicators Aching;Sore   Pain Onset More than a month ago   Pain Frequency Intermittent   Aggravating Factors  working at the computer, sitting long periods, driving, preparing meals   Pain Relieving Factors muscle relaxers as needed, foam roll, rest, heat   Multiple Pain Sites No                       [facility] Adult PT Treatment/Exercise - 02/13/15 0001    Neck Exercises: Machines for Strengthening   UBE (Upper Arm Bike) L1 x21min  PTA present to discuss progress   Shoulder Exercises: Supine   Horizontal ABduction Strengthening  with red-t-band on foam roll    Theraband Level (Shoulder Horizontal ABduction) Level 2 (Red)   Modalities   Modalities Electrical Stimulation;Moist Heat   Moist Heat Therapy   Number Minutes  Moist Heat 15 Minutes   Moist Heat Location Other (comment)  Neck   Electrical Stimulation   Electrical Stimulation Location neck   Electrical Stimulation Action IFC   Electrical Stimulation Parameters 15 minutes   Electrical Stimulation Goals Pain   Ultrasound   Ultrasound Location bil neck   Ultrasound Parameters 1.3 w/cm2 continuous x 8 minutes   Ultrasound Goals Pain                     PT Long Term Goals - 02/13/15 0944    PT LONG TERM GOAL #1   Title demonstrate understanding and demo correct posture   Status Achieved   PT LONG TERM GOAL #2   Title be independent with advanced HEP   Time 8   Period Weeks   Status On-going  Independent and compliant in HEP   PT LONG TERM GOAL #3   Title reduce FOTO to < or = to 28% limitation   Time 8   Period Weeks   Status On-going  Will issue FOTO suvey next week   PT LONG TERM GOAL #4   Title report a 70% reduction in neck pain with driving   Time 8   Period Weeks   Status On-going  60-70% reported last week and flare-up this week  PT LONG TERM GOAL #5   Title report < or = to 3-4/10 neck pain at the end of the day   Time 8   Period Weeks   Status On-going  up to 5/10 this week               Plan - 02/13/15 0946    Clinical Impression Statement Pt with flare-up of pain this week secondary to sitting over the weekend.  see goals for status.  Pt will try to see MD next week.   Pt will benefit from skilled therapeutic intervention in order to improve on the following deficits Improper body mechanics;Decreased activity tolerance;Increased muscle spasms;Pain;Decreased mobility;Decreased range of motion;Increased fascial restricitons   PT Frequency 2x / week   PT Duration 4 weeks   PT Treatment/Interventions ADLs/Self Care Home Management;Moist Heat;Therapeutic activities;Patient/family education;Therapeutic exercise;Traction;Ultrasound;Manual techniques;Cryotherapy;Neuromuscular re-education;Electrical  Stimulation   PT Next Visit Plan Continue with endurance and strength, posture awarness.  FOTO next session and D/C to HEP.   Consulted and Agree with Plan of Care Patient        Problem List There are no active problems to display for this patient.   Sydnei Ohaver, PT 02/13/2015, 10:12 AM  [place] [facility] [address], [address] [place], [place], [address] Phone: [phone]   Fax:  [phone]

## 2015-02-18 ENCOUNTER — Ambulatory Visit: Payer: BLUE CROSS/BLUE SHIELD | Admitting: Physical Therapy

## 2015-02-18 ENCOUNTER — Encounter: Payer: Self-pay | Admitting: Physical Therapy

## 2015-02-18 DIAGNOSIS — M62838 Other muscle spasm: Secondary | ICD-10-CM

## 2015-02-18 DIAGNOSIS — M542 Cervicalgia: Secondary | ICD-10-CM

## 2015-02-18 DIAGNOSIS — S199XXD Unspecified injury of neck, subsequent encounter: Secondary | ICD-10-CM | POA: Diagnosis not present

## 2015-02-18 NOTE — Therapy (Signed)
[facility] [address] [place], [place], [address] Phone: [phone]   Fax:  [phone]  Physical Therapy Treatment  Patient Details  Name: Brenda Brown MRN: [id number] Date of Birth: 09/15/1976 Referring Provider:  Darrow Bussing, MD  Encounter Date: 02/18/2015      PT End of Session - 02/18/15 1731    Visit Number 19   Date for PT Re-Evaluation 02/22/15   PT Start Time 1108   PT Stop Time 1204   PT Time Calculation (min) 56 min   Activity Tolerance Patient tolerated treatment well   Behavior During Therapy [facility] for tasks assessed/performed      Past Medical History  Diagnosis Date  . Allergy     History reviewed. No pertinent past surgical history.  There were no vitals filed for this visit.  Visit Diagnosis:  Neck pain, bilateral  Muscle spasms of head or neck  Neck pain on right side      Subjective Assessment - 02/18/15 1117    Symptoms After prolonged sitting pain in neck increses, but less intense and shorter duration   Limitations Sitting   How long can you sit comfortably? 60 minutes, than need to change position   How long can you stand comfortably? still difficult to prepare food with extended periods of stearing   Currently in Pain? Yes   Pain Score 2    Pain Location Neck   Pain Orientation Right;Left   Pain Descriptors / Indicators Aching;Sore   Pain Type Chronic pain   Pain Onset More than a month ago   Pain Frequency Intermittent   Aggravating Factors  sitting for prolonged time at the computer or with driving, preparing meals   Pain Relieving Factors muscle relaxers as needed, faoa roll, rest, heat   Effect of Pain on Daily Activities frequently needs to change positions   Multiple Pain Sites No                       [facility] Adult PT Treatment/Exercise - 02/18/15 0001    Neck Exercises: Machines for Strengthening   UBE (Upper Arm Bike) L1 x4min  PTA  present to discuss progress   Shoulder Exercises: Supine   Horizontal ABduction Strengthening  with red-t-band on foam roll    Theraband Level (Shoulder Horizontal ABduction) Level 2 (Red)   Flexion AROM;10 reps  with constant pull for multifidius strength   Theraband Level (Shoulder Flexion) Level 2 (Red)   Modalities   Modalities Electrical Stimulation   Moist Heat Therapy   Number Minutes Moist Heat 15 Minutes   Moist Heat Location Other (comment)   Electrical Stimulation   Electrical Stimulation Location neck   Electrical Stimulation Action IFC   Electrical Stimulation Parameters 80-150Hz    Electrical Stimulation Goals Pain   Ultrasound   Ultrasound Location bil neck   Ultrasound Parameters 1.3 Wcm   Ultrasound Goals Pain                     PT Long Term Goals - 02/18/15 1733    PT LONG TERM GOAL #1   Title demonstrate understanding and demo correct posture   Time 8   Period Weeks   Status Achieved   PT LONG TERM GOAL #2   Title be independent with advanced HEP   Time 8   Period Weeks   Status On-going   PT LONG TERM GOAL #3   Title reduce FOTO to <  or = to 28% limitation   Time 8   Period Weeks   Status On-going   PT LONG TERM GOAL #4   Title report a 70% reduction in neck pain with driving   Time 8   Period Weeks   Status On-going   PT LONG TERM GOAL #5   Title report < or = to 3-4/10 neck pain at the end of the day   Time 8   Period Weeks   Status On-going               Plan - 02/18/15 1732    Clinical Impression Statement Pt. feels better today, but reports with prolonged sitting or driving neck discomfort increases   Pt will benefit from skilled therapeutic intervention in order to improve on the following deficits Improper body mechanics;Decreased activity tolerance;Increased muscle spasms;Pain;Decreased mobility;Decreased range of motion;Increased fascial restricitons   Rehab Potential Good   PT Frequency 2x / week   PT  Duration 4 weeks   PT Treatment/Interventions ADLs/Self Care Home Management;Moist Heat;Therapeutic activities;Patient/family education;Therapeutic exercise;Traction;Ultrasound;Manual techniques;Cryotherapy;Neuromuscular re-education;Electrical Stimulation   PT Next Visit Plan Continue with endurance and strength, posture awarness.  FOTO next session and D/C to HEP.   PT Home Exercise Plan Add theraband to HEP- horizonal abduction    Consulted and Agree with Plan of Care Patient        Problem List There are no active problems to display for this patient.   NAUMANN-HOUEGNIFIO,Brock Mokry PTA 02/18/2015, 5:36 PM  [place] [facility] [address], [address] [place], [place], [address] Phone: [phone]   Fax:  [phone]

## 2015-02-20 ENCOUNTER — Ambulatory Visit: Payer: BLUE CROSS/BLUE SHIELD | Admitting: Physical Therapy

## 2015-02-20 ENCOUNTER — Encounter: Payer: Self-pay | Admitting: Physical Therapy

## 2015-02-20 DIAGNOSIS — S199XXD Unspecified injury of neck, subsequent encounter: Secondary | ICD-10-CM | POA: Diagnosis not present

## 2015-02-20 DIAGNOSIS — M542 Cervicalgia: Secondary | ICD-10-CM

## 2015-02-20 DIAGNOSIS — M62838 Other muscle spasm: Secondary | ICD-10-CM

## 2015-02-20 NOTE — Therapy (Addendum)
[facility] [address], [place], [place], [address] Phone: [phone]   Fax:  [phone]  Physical Therapy Treatment  Patient Details  Name: Brenda Brown MRN: [id number] Date of Birth: Oct 13, 1976 Referring Provider:  Lujean Amel, MD  Encounter Date: 02/20/2015      PT End of Session - 02/20/15 1012    Visit Number 20   Date for PT Re-Evaluation 02/22/15   PT Start Time 0936   PT Stop Time 1030   PT Time Calculation (min) 54 min   Activity Tolerance Patient tolerated treatment well   Behavior During Therapy [facility] for tasks assessed/performed      Past Medical History  Diagnosis Date  . Allergy     History reviewed. No pertinent past surgical history.  There were no vitals filed for this visit.  Visit Diagnosis:  Neck pain, bilateral  Muscle spasms of head or neck  Neck pain on right side          [facility] PT Assessment - 02/20/15 0001    Assessment   Medical Diagnosis neck pain (M54.2)   Onset Date 11/15/14   AROM   Cervical Flexion WFL   Cervical Extension WFL   Cervical - Right Side Bend WFL   Cervical - Left Side Bend WFL   Cervical - Right Rotation 10% limited  pain   Cervical - Left Rotation WFL   Strength   Overall Strength Deficits   Overall Strength Comments Bilateral shoulder flexion and abduction 4/5                    [facility] Adult PT Treatment/Exercise - 02/20/15 0001    Neck Exercises: Machines for Strengthening   UBE (Upper Arm Bike) L1 x34mn  PTA present to discuss progress   Neck Exercises: Prone   Other Prone Exercise diagonal looking up/down each side x 5   Shoulder Exercises: Supine   Horizontal ABduction Strengthening  with red-t-band on foam roll    Theraband Level (Shoulder Horizontal ABduction) Level 2 (Red)   Flexion AROM;10 reps  with constant pull for multifidius strength   Theraband Level (Shoulder Flexion) Level 2 (Red)   Other Supine  Exercises Foam roll x238m with cervical retraction for extensor activation   Modalities   Modalities Electrical Stimulation   Moist Heat Therapy   Number Minutes Moist Heat 15 Minutes   Moist Heat Location Other (comment)   Electrical Stimulation   Electrical Stimulation Location neck   Electrical Stimulation Action IFC   Electrical Stimulation Parameters 80-150Hz    Ultrasound   Ultrasound Location bil neck   Ultrasound Parameters 1.3W/cm2   Ultrasound Goals Pain                     PT Long Term Goals - 02/20/15 1040    PT LONG TERM GOAL #1   Title demonstrate understanding and demo correct posture   Status Achieved   PT LONG TERM GOAL #2   Title be independent with advanced HEP   Status Achieved   PT LONG TERM GOAL #3   Title reduce FOTO to < or = to 28% limitation   Status Not Met  Not assessed on final visit   PT LONG TERM GOAL #4   Title report a 70% reduction in neck pain with driving   Status Achieved  85% improvement since the start of care   PT LONG TERM GOAL #5   Title report < or =  to 3-4/10 neck pain at the end of the day   Status Not Met  Pain levels fluctuate based on day and activity               Plan - 02/20/15 1013    Clinical Impression Statement Pt continues to feel improvement in neck, no complain of pain today.  Pt reports 85% overall improvement since the start of care and will D/C to HEP.   Pt will benefit from skilled therapeutic intervention in order to improve on the following deficits --   Rehab Potential Good   PT Frequency --   PT Duration --   PT Treatment/Interventions --   PT Next Visit Plan D/C to HEP   PT Home Exercise Plan --   Consulted and Agree with Plan of Care Patient        Problem List There are no active problems to display for this patient. PHYSICAL THERAPY DISCHARGE SUMMARY  Visits from Start of Care: 20  Current functional level related to goals / functional outcomes: See above for goal  assessment.  Pt reports 85% overall improvement since the start of care.  Pain continues to fluctuate with long periods of driving or desk work.    Remaining deficits: Pt with continued intermittent neck pain.  Pt has HEP in place and is using postural corrections to improve pain.    Education / Equipment: HEP, posture education Plan: Patient agrees to discharge.  Patient goals were partially met. Patient is being discharged due to being pleased with the current functional level.  ?????      TAKACS,KELLY PT 02/20/2015, 10:45 AM  [facility] [address], [place], [place], [address] Phone: [phone]   Fax:  [phone]

## 2015-05-31 ENCOUNTER — Other Ambulatory Visit: Payer: Self-pay | Admitting: Family Medicine

## 2015-05-31 DIAGNOSIS — M5412 Radiculopathy, cervical region: Secondary | ICD-10-CM

## 2015-06-07 ENCOUNTER — Ambulatory Visit
Admission: RE | Admit: 2015-06-07 | Discharge: 2015-06-07 | Disposition: A | Discharge status: Home / Self Care | Payer: BLUE CROSS/BLUE SHIELD | Source: Ambulatory Visit | Attending: Family Medicine | Admitting: Family Medicine

## 2015-06-07 DIAGNOSIS — M5412 Radiculopathy, cervical region: Secondary | ICD-10-CM

## 2017-10-29 DIAGNOSIS — K219 Gastro-esophageal reflux disease without esophagitis: Secondary | ICD-10-CM | POA: Diagnosis not present

## 2017-11-18 DIAGNOSIS — Z0000 Encounter for general adult medical examination without abnormal findings: Secondary | ICD-10-CM | POA: Diagnosis not present

## 2017-11-18 DIAGNOSIS — Z13220 Encounter for screening for lipoid disorders: Secondary | ICD-10-CM | POA: Diagnosis not present

## 2018-01-19 DIAGNOSIS — M25552 Pain in left hip: Secondary | ICD-10-CM | POA: Diagnosis not present

## 2018-03-28 ENCOUNTER — Other Ambulatory Visit: Payer: Self-pay | Admitting: Obstetrics and Gynecology

## 2018-03-28 ENCOUNTER — Other Ambulatory Visit (HOSPITAL_COMMUNITY)
Admission: RE | Admit: 2018-03-28 | Discharge: 2018-03-28 | Disposition: A | Discharge status: Home / Self Care | Payer: BLUE CROSS/BLUE SHIELD | Source: Ambulatory Visit | Attending: Obstetrics and Gynecology | Admitting: Obstetrics and Gynecology

## 2018-03-28 DIAGNOSIS — Z01411 Encounter for gynecological examination (general) (routine) with abnormal findings: Secondary | ICD-10-CM | POA: Insufficient documentation

## 2018-03-28 DIAGNOSIS — Z01419 Encounter for gynecological examination (general) (routine) without abnormal findings: Secondary | ICD-10-CM | POA: Diagnosis not present

## 2018-03-29 LAB — CYTOLOGY - PAP
Diagnosis: NEGATIVE
HPV: NOT DETECTED

## 2018-04-01 ENCOUNTER — Other Ambulatory Visit: Payer: Self-pay | Admitting: Family Medicine

## 2018-04-01 DIAGNOSIS — Z1231 Encounter for screening mammogram for malignant neoplasm of breast: Secondary | ICD-10-CM

## 2018-04-27 ENCOUNTER — Encounter: Payer: Self-pay | Admitting: Radiology

## 2018-04-27 ENCOUNTER — Ambulatory Visit
Admission: RE | Admit: 2018-04-27 | Discharge: 2018-04-27 | Disposition: A | Discharge status: Home / Self Care | Payer: BLUE CROSS/BLUE SHIELD | Source: Ambulatory Visit | Attending: Family Medicine | Admitting: Family Medicine

## 2018-04-27 DIAGNOSIS — Z1231 Encounter for screening mammogram for malignant neoplasm of breast: Secondary | ICD-10-CM | POA: Diagnosis not present

## 2018-04-28 ENCOUNTER — Other Ambulatory Visit: Payer: Self-pay | Admitting: Family Medicine

## 2018-04-28 DIAGNOSIS — R928 Other abnormal and inconclusive findings on diagnostic imaging of breast: Secondary | ICD-10-CM

## 2018-04-29 ENCOUNTER — Ambulatory Visit
Admission: RE | Admit: 2018-04-29 | Discharge: 2018-04-29 | Disposition: A | Discharge status: Home / Self Care | Payer: BLUE CROSS/BLUE SHIELD | Source: Ambulatory Visit | Attending: Family Medicine | Admitting: Family Medicine

## 2018-04-29 ENCOUNTER — Other Ambulatory Visit: Payer: Self-pay | Admitting: Family Medicine

## 2018-04-29 DIAGNOSIS — N6320 Unspecified lump in the left breast, unspecified quadrant: Secondary | ICD-10-CM | POA: Diagnosis not present

## 2018-04-29 DIAGNOSIS — R922 Inconclusive mammogram: Secondary | ICD-10-CM | POA: Diagnosis not present

## 2018-04-29 DIAGNOSIS — R928 Other abnormal and inconclusive findings on diagnostic imaging of breast: Secondary | ICD-10-CM

## 2018-06-07 DIAGNOSIS — H04123 Dry eye syndrome of bilateral lacrimal glands: Secondary | ICD-10-CM | POA: Diagnosis not present

## 2018-07-15 DIAGNOSIS — H6981 Other specified disorders of Eustachian tube, right ear: Secondary | ICD-10-CM | POA: Diagnosis not present

## 2018-10-31 ENCOUNTER — Ambulatory Visit
Admission: RE | Admit: 2018-10-31 | Discharge: 2018-10-31 | Disposition: A | Discharge status: Home / Self Care | Payer: BLUE CROSS/BLUE SHIELD | Source: Ambulatory Visit | Attending: Family Medicine | Admitting: Family Medicine

## 2018-10-31 ENCOUNTER — Other Ambulatory Visit: Payer: Self-pay | Admitting: Family Medicine

## 2018-10-31 DIAGNOSIS — N6320 Unspecified lump in the left breast, unspecified quadrant: Secondary | ICD-10-CM

## 2018-10-31 DIAGNOSIS — N6321 Unspecified lump in the left breast, upper outer quadrant: Secondary | ICD-10-CM | POA: Diagnosis not present

## 2018-10-31 DIAGNOSIS — N6323 Unspecified lump in the left breast, lower outer quadrant: Secondary | ICD-10-CM | POA: Diagnosis not present

## 2018-10-31 DIAGNOSIS — R922 Inconclusive mammogram: Secondary | ICD-10-CM | POA: Diagnosis not present

## 2018-12-05 DIAGNOSIS — Z0000 Encounter for general adult medical examination without abnormal findings: Secondary | ICD-10-CM | POA: Diagnosis not present

## 2018-12-05 DIAGNOSIS — Z13220 Encounter for screening for lipoid disorders: Secondary | ICD-10-CM | POA: Diagnosis not present

## 2019-05-12 ENCOUNTER — Ambulatory Visit
Admission: RE | Admit: 2019-05-12 | Discharge: 2019-05-12 | Disposition: A | Discharge status: Home / Self Care | Payer: BLUE CROSS/BLUE SHIELD | Source: Ambulatory Visit | Attending: Family Medicine | Admitting: Family Medicine

## 2019-05-12 ENCOUNTER — Other Ambulatory Visit: Payer: Self-pay

## 2019-05-12 DIAGNOSIS — N6321 Unspecified lump in the left breast, upper outer quadrant: Secondary | ICD-10-CM | POA: Diagnosis not present

## 2019-05-12 DIAGNOSIS — N6320 Unspecified lump in the left breast, unspecified quadrant: Secondary | ICD-10-CM

## 2019-05-12 DIAGNOSIS — R922 Inconclusive mammogram: Secondary | ICD-10-CM | POA: Diagnosis not present

## 2019-05-12 DIAGNOSIS — N6323 Unspecified lump in the left breast, lower outer quadrant: Secondary | ICD-10-CM | POA: Diagnosis not present

## 2019-08-09 DIAGNOSIS — Z23 Encounter for immunization: Secondary | ICD-10-CM | POA: Diagnosis not present

## 2020-06-14 ENCOUNTER — Other Ambulatory Visit: Payer: Self-pay | Admitting: Family Medicine

## 2020-06-14 DIAGNOSIS — N6320 Unspecified lump in the left breast, unspecified quadrant: Secondary | ICD-10-CM

## 2020-06-27 ENCOUNTER — Other Ambulatory Visit: Payer: Self-pay

## 2020-06-27 ENCOUNTER — Ambulatory Visit
Admission: RE | Admit: 2020-06-27 | Discharge: 2020-06-27 | Disposition: A | Discharge status: Home / Self Care | Payer: BC Managed Care – PPO | Source: Ambulatory Visit | Attending: Family Medicine | Admitting: Family Medicine

## 2020-06-27 DIAGNOSIS — N6320 Unspecified lump in the left breast, unspecified quadrant: Secondary | ICD-10-CM

## 2021-07-01 ENCOUNTER — Other Ambulatory Visit: Payer: Self-pay | Admitting: Family Medicine

## 2021-07-01 DIAGNOSIS — Z1231 Encounter for screening mammogram for malignant neoplasm of breast: Secondary | ICD-10-CM

## 2021-08-21 ENCOUNTER — Other Ambulatory Visit: Payer: Self-pay

## 2021-08-21 ENCOUNTER — Ambulatory Visit
Admission: RE | Admit: 2021-08-21 | Discharge: 2021-08-21 | Disposition: A | Discharge status: Home / Self Care | Payer: No Typology Code available for payment source | Source: Ambulatory Visit | Attending: Family Medicine | Admitting: Family Medicine

## 2021-08-21 DIAGNOSIS — Z1231 Encounter for screening mammogram for malignant neoplasm of breast: Secondary | ICD-10-CM

## 2021-11-08 IMAGING — MG MM DIGITAL SCREENING BILAT W/ TOMO AND CAD
8 series · 9 of 24 positions shown · non-contrast
Comparison: Previous exam(s).

CLINICAL DATA: Screening.

EXAM:
DIGITAL SCREENING BILATERAL MAMMOGRAM WITH TOMOSYNTHESIS AND CAD
TECHNIQUE: Bilateral screening digital craniocaudal and mediolateral oblique
mammograms were obtained. Bilateral screening digital breast
tomosynthesis was performed. The images were evaluated with
computer-aided detection.

[R MLO synth-2D]
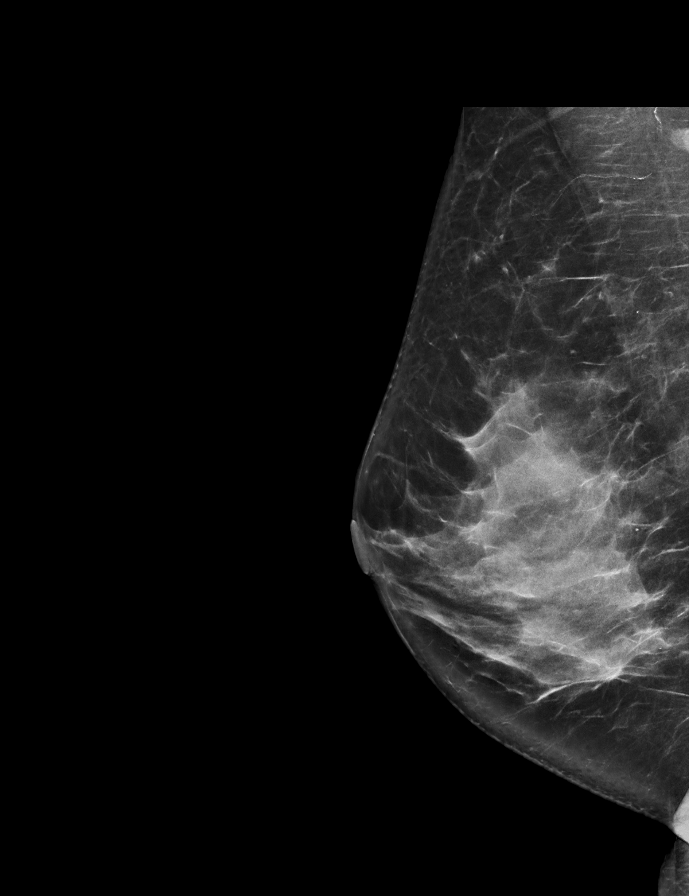

[R CC synth-2D]
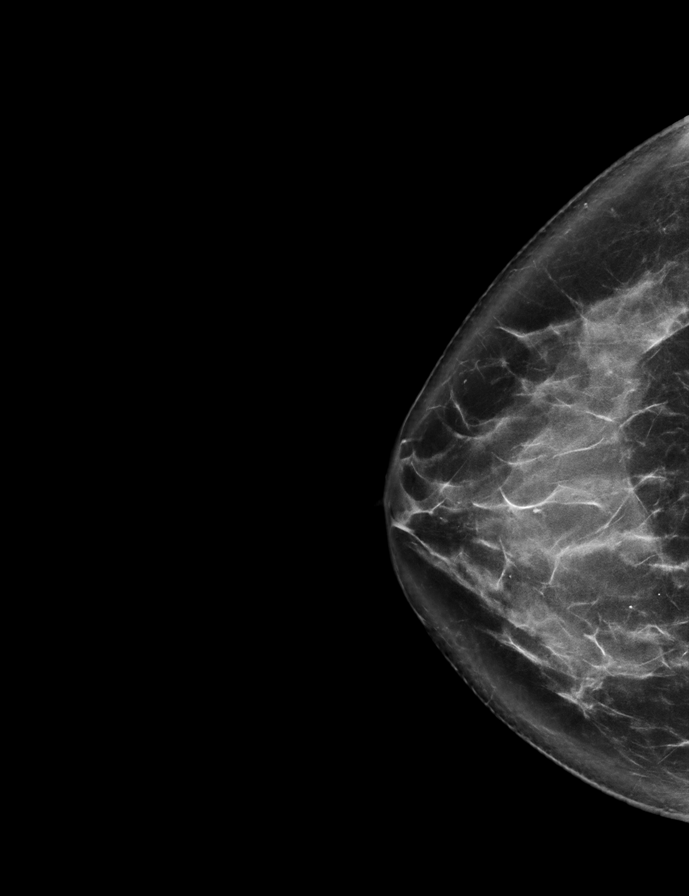

[L CC synth-2D]
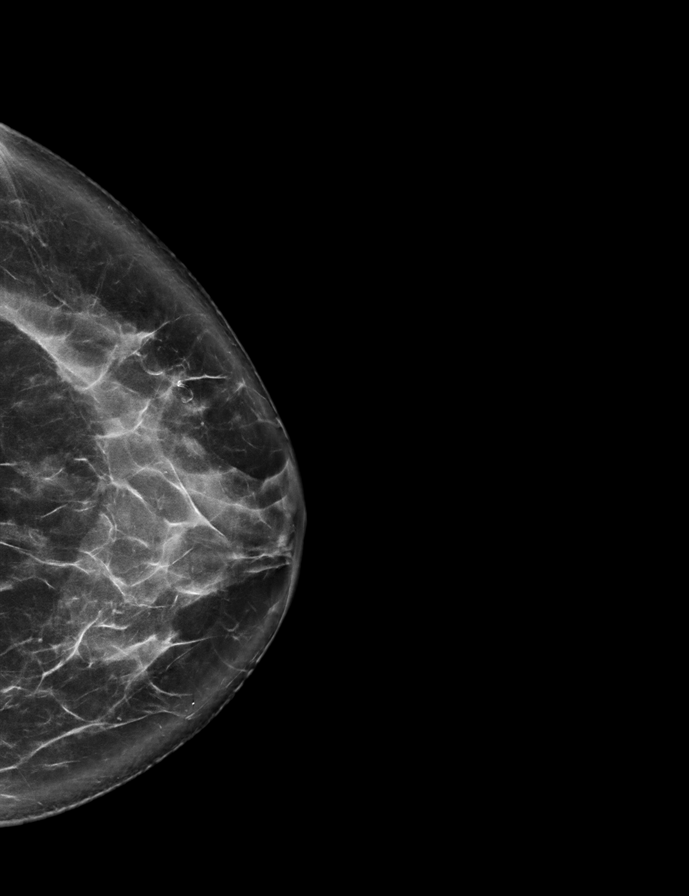

[L MLO synth-2D]
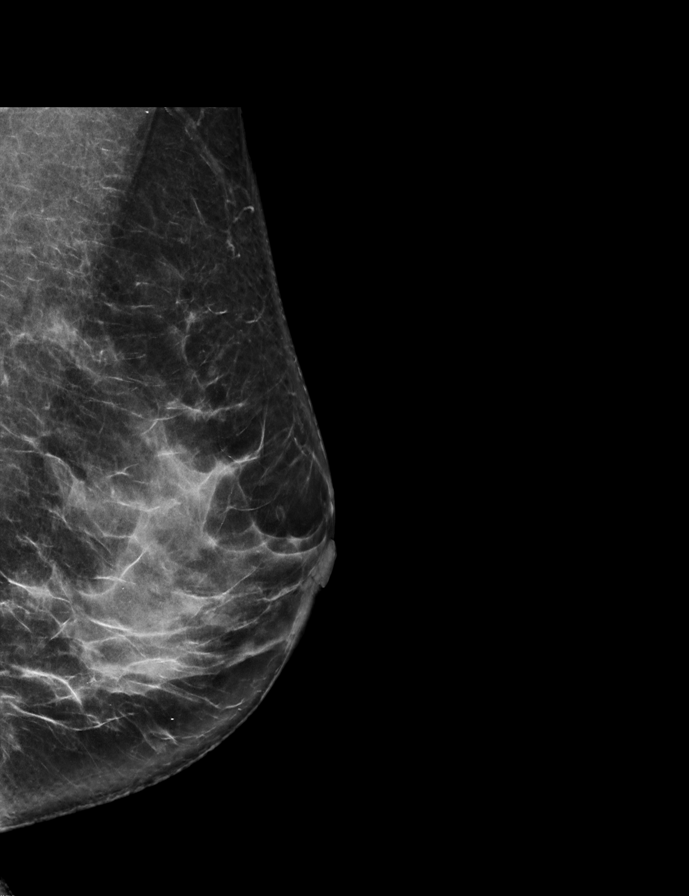

[R CC tomo · 2 of 81 frames shown]
[frame 27/81]
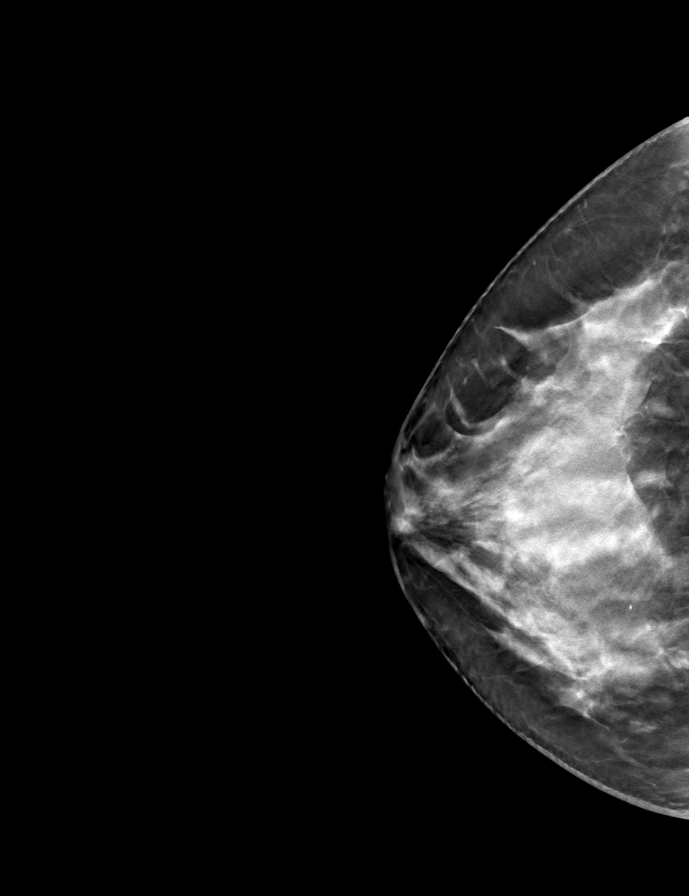
[frame 41/81]
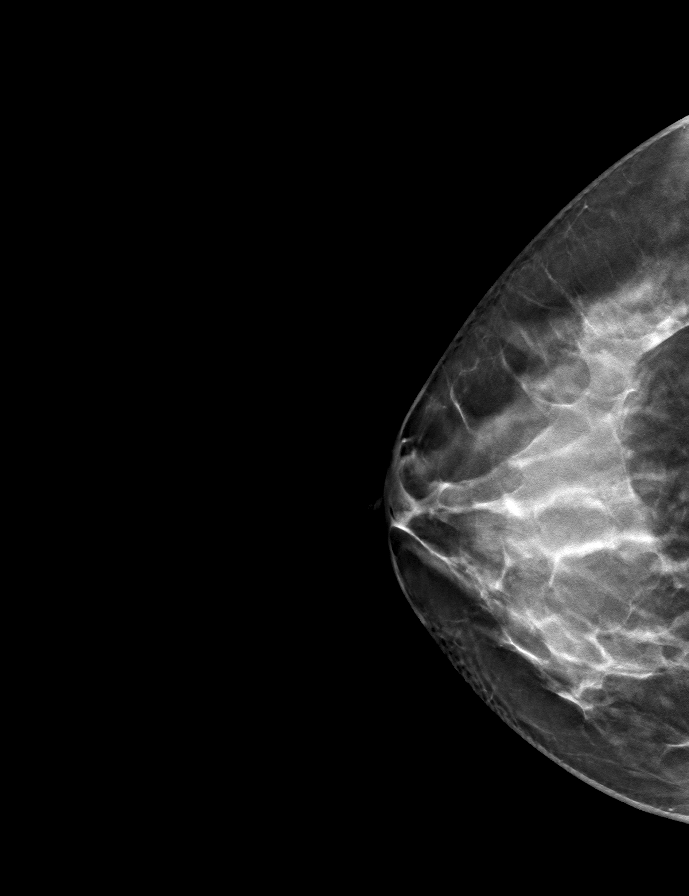

[R MLO tomo · tomo slice 41/80.0]
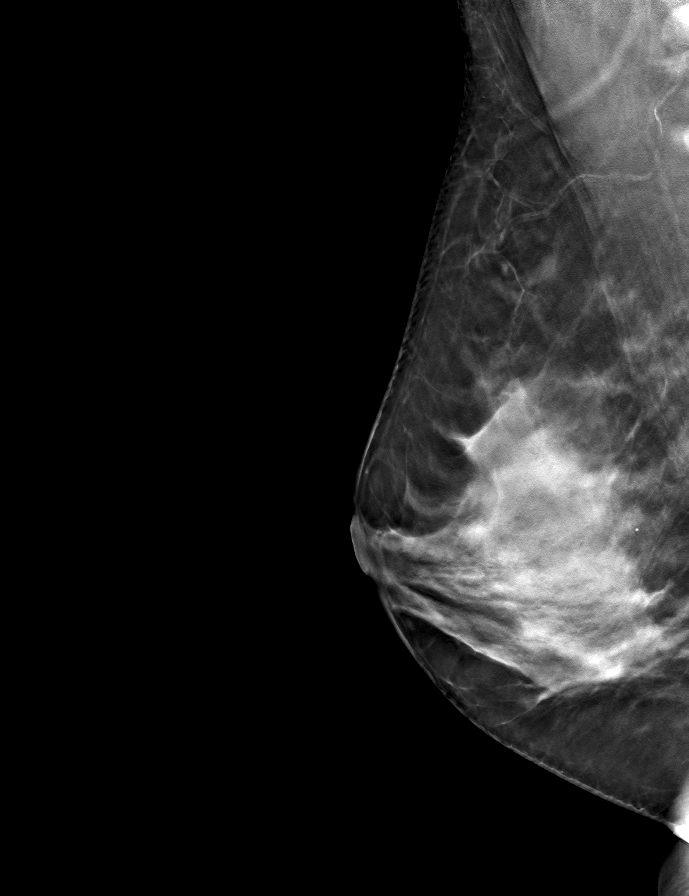

[L MLO tomo · tomo slice 38/75.0]
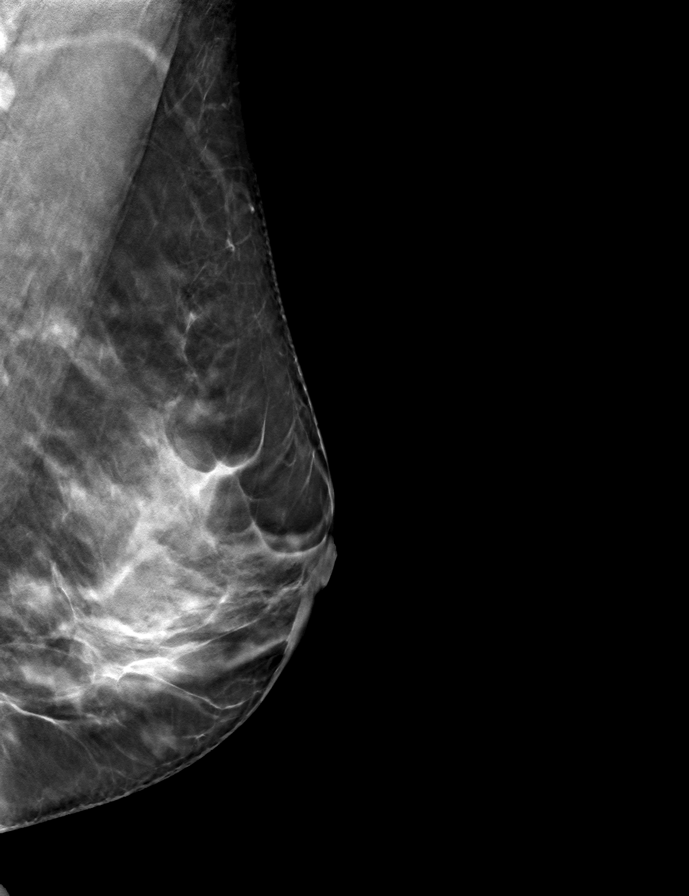

[L CC tomo · tomo slice 41/80.0]
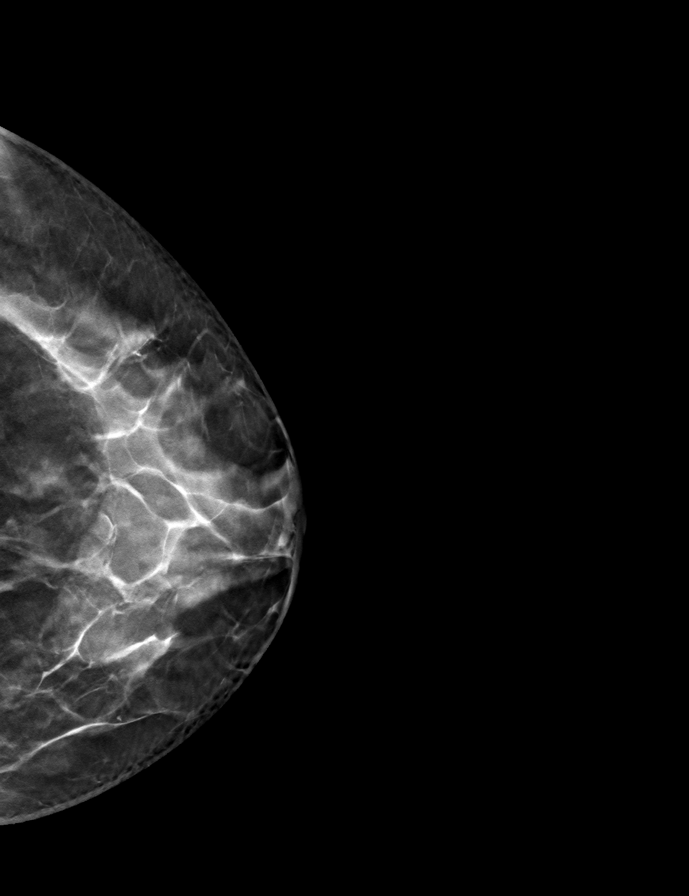

[9 of 24 positions shown; findings below may reference images not displayed]

ACR Breast Density Category d: The breast tissue is extremely dense,
which lowers the sensitivity of mammography
FINDINGS: There are no findings suspicious for malignancy.
IMPRESSION: No mammographic evidence of malignancy. A result letter of this
screening mammogram will be mailed directly to the patient.

RECOMMENDATION:
Screening mammogram in one year. (Code:[id number]-[id number])

BI-RADS CATEGORY  1: Negative.

## 2022-07-14 ENCOUNTER — Other Ambulatory Visit: Payer: Self-pay | Admitting: Family Medicine

## 2022-07-14 DIAGNOSIS — Z1231 Encounter for screening mammogram for malignant neoplasm of breast: Secondary | ICD-10-CM

## 2022-08-24 ENCOUNTER — Ambulatory Visit
Admission: RE | Admit: 2022-08-24 | Discharge: 2022-08-24 | Disposition: A | Discharge status: Home / Self Care | Payer: No Typology Code available for payment source | Source: Ambulatory Visit | Attending: Family Medicine | Admitting: Family Medicine

## 2022-08-24 DIAGNOSIS — Z1231 Encounter for screening mammogram for malignant neoplasm of breast: Secondary | ICD-10-CM

## 2023-03-17 ENCOUNTER — Other Ambulatory Visit (HOSPITAL_BASED_OUTPATIENT_CLINIC_OR_DEPARTMENT_OTHER): Payer: Self-pay | Admitting: Family Medicine

## 2023-03-17 DIAGNOSIS — E7800 Pure hypercholesterolemia, unspecified: Secondary | ICD-10-CM

## 2023-04-16 ENCOUNTER — Ambulatory Visit (HOSPITAL_BASED_OUTPATIENT_CLINIC_OR_DEPARTMENT_OTHER)
Admission: RE | Admit: 2023-04-16 | Discharge: 2023-04-16 | Disposition: A | Discharge status: Home / Self Care | Payer: Self-pay | Source: Ambulatory Visit | Attending: Family Medicine | Admitting: Family Medicine

## 2023-04-16 DIAGNOSIS — E7800 Pure hypercholesterolemia, unspecified: Secondary | ICD-10-CM | POA: Insufficient documentation

## 2023-09-01 ENCOUNTER — Other Ambulatory Visit: Payer: Self-pay | Admitting: Family Medicine

## 2023-09-01 DIAGNOSIS — Z1231 Encounter for screening mammogram for malignant neoplasm of breast: Secondary | ICD-10-CM

## 2023-09-08 ENCOUNTER — Ambulatory Visit
Admission: RE | Admit: 2023-09-08 | Discharge: 2023-09-08 | Disposition: A | Discharge status: Home / Self Care | Payer: No Typology Code available for payment source | Source: Ambulatory Visit | Attending: Family Medicine | Admitting: Family Medicine

## 2023-09-08 DIAGNOSIS — Z1231 Encounter for screening mammogram for malignant neoplasm of breast: Secondary | ICD-10-CM

## 2023-09-30 ENCOUNTER — Other Ambulatory Visit (HOSPITAL_COMMUNITY)
Admission: RE | Admit: 2023-09-30 | Discharge: 2023-09-30 | Disposition: A | Discharge status: Home / Self Care | Payer: No Typology Code available for payment source | Source: Ambulatory Visit | Attending: Obstetrics and Gynecology | Admitting: Obstetrics and Gynecology

## 2023-09-30 DIAGNOSIS — Z01419 Encounter for gynecological examination (general) (routine) without abnormal findings: Secondary | ICD-10-CM | POA: Insufficient documentation

## 2023-10-05 LAB — CYTOLOGY - PAP
Comment: NEGATIVE
Diagnosis: NEGATIVE
High risk HPV: NEGATIVE

## 2024-03-31 ENCOUNTER — Ambulatory Visit: Admitting: Podiatry

## 2024-03-31 DIAGNOSIS — M722 Plantar fascial fibromatosis: Secondary | ICD-10-CM

## 2024-03-31 NOTE — Progress Notes (Unsigned)
  Subjective:  Patient ID: Brenda Brown, female    DOB: [birth year],  MRN: [id number]  Chief Complaint  Patient presents with   Foot Pain    Right heel discomfort     48 y.o. female presents with the above complaint.  Patient presents with right heel pain that has been off for quite some time is progressive gotten worse worse with ambulation hurts with pressure she still hurts when getting out of bed.  Pain scale 7 out of 10 dull aching nature she also has secondary complaint of a red patch that sporadically starts.  She wanted to get that evaluated.  She does not happen all the time has recently gotten more pronounced and more red.  She wanted to evaluate that as well   Review of Systems: Negative except as noted in the HPI. Denies N/V/F/Ch.  Past Medical History:  Diagnosis Date   Allergy     Current Outpatient Medications:    ferrous fumarate (HEMOCYTE - 106 MG FE) 325 (106 FE) MG TABS tablet, Take 1 tablet by mouth., Disp: , Rfl:    loratadine-pseudoephedrine (CLARITIN-D 24-HOUR) 10-240 MG per 24 hr tablet, Take 1 tablet by mouth daily., Disp: , Rfl:    methocarbamol (ROBAXIN) 750 MG tablet, Take 750 mg by mouth 4 (four) times daily., Disp: , Rfl:   Social History   Tobacco Use  Smoking Status Never  Smokeless Tobacco Not on file    Not on File Objective:  There were no vitals filed for this visit. There is no height or weight on file to calculate BMI. Constitutional Well developed. Well nourished.  Vascular Dorsalis pedis pulses palpable bilaterally. Posterior tibial pulses palpable bilaterally. Capillary refill normal to all digits.  No cyanosis or clubbing noted. Pedal hair growth normal.  Neurologic Normal speech. Oriented to person, place, and time. Epicritic sensation to light touch grossly present bilaterally.  Dermatologic Nails well groomed and normal in appearance. No open wounds. No skin lesions.  Orthopedic: Normal joint ROM without pain or crepitus  bilaterally. No visible deformities. Tender to palpation at the calcaneal tuber right. No pain with calcaneal squeeze right. Ankle ROM diminished range of motion right. Silfverskiold Test: positive right.   Radiographs: None  Assessment:   1. Plantar fasciitis of right foot    Plan:  Patient was evaluated and treated and all questions answered.  Right heel superficial blister/red patch - At this time I discussed with the patient there is no clinical concern for this.  I am not able to appreciate any red patches today.  Will continue to monitor if it regresses I discussed with her to keep an eye out on it she states understanding  Plantar Fasciitis, right - XR reviewed as above.  - Educated on icing and stretching. Instructions given.  - Injection delivered to the plantar fascia as below. - DME: Plantar fascial brace dispensed to support the medial longitudinal arch of the foot and offload pressure from the heel and prevent arch collapse during weightbearing - Pharmacologic management: None   Procedure: Injection Tendon/Ligament Location: Right plantar fascia at the glabrous junction; medial approach. Skin Prep: alcohol Injectate: 0.5 cc 0.5% marcaine plain, 0.5 cc of 1% Lidocaine, 0.5 cc kenalog 10. Disposition: Patient tolerated procedure well. Injection site dressed with a band-aid.  No follow-ups on file.  Right heel patch likely riction blister continue to monitor

## 2024-04-28 ENCOUNTER — Ambulatory Visit: Admitting: Podiatry

## 2024-04-28 DIAGNOSIS — M722 Plantar fascial fibromatosis: Secondary | ICD-10-CM

## 2024-04-28 DIAGNOSIS — M21961 Unspecified acquired deformity of right lower leg: Secondary | ICD-10-CM

## 2024-04-28 DIAGNOSIS — M21962 Unspecified acquired deformity of left lower leg: Secondary | ICD-10-CM

## 2024-04-28 NOTE — Progress Notes (Signed)
 Orthotics   Patient was present and evaluated for Custom molded foot orthotics. Patient will benefit from CFO's to provide total contact to BIL MLA's helping to balance and distribute body weight more evenly across BIL feet helping to reduce plantar pressure and pain. Orthotic will also encourage FF / RF alignment  Patient was scanned today and will return for fitting upon receipt

## 2024-04-28 NOTE — Progress Notes (Signed)
 Subjective:  Patient ID: Brenda Brown, female    DOB: 1976-06-30,  MRN: [id number]  Chief Complaint  Patient presents with   Plantar Fasciitis    48 y.o. female presents with the above complaint.  Patient presents follow-up of right plantar fascia states injection helped considerably.  She would like to do another injection she still has some residual pain.  She also like to obtain orthotics denies any other acute complaints   Review of Systems: Negative except as noted in the HPI. Denies N/V/F/Ch.  Past Medical History:  Diagnosis Date   Allergy     Current Outpatient Medications:    ferrous fumarate (HEMOCYTE - 106 MG FE) 325 (106 FE) MG TABS tablet, Take 1 tablet by mouth., Disp: , Rfl:    loratadine-pseudoephedrine (CLARITIN-D 24-HOUR) 10-240 MG per 24 hr tablet, Take 1 tablet by mouth daily., Disp: , Rfl:    methocarbamol (ROBAXIN) 750 MG tablet, Take 750 mg by mouth 4 (four) times daily., Disp: , Rfl:   Social History   Tobacco Use  Smoking Status Never  Smokeless Tobacco Not on file    Not on File Objective:  There were no vitals filed for this visit. There is no height or weight on file to calculate BMI. Constitutional Well developed. Well nourished.  Vascular Dorsalis pedis pulses palpable bilaterally. Posterior tibial pulses palpable bilaterally. Capillary refill normal to all digits.  No cyanosis or clubbing noted. Pedal hair growth normal.  Neurologic Normal speech. Oriented to person, place, and time. Epicritic sensation to light touch grossly present bilaterally.  Dermatologic Nails well groomed and normal in appearance. No open wounds. No skin lesions.  Orthopedic: Normal joint ROM without pain or crepitus bilaterally. No visible deformities. Tender to palpation at the calcaneal tuber right. No pain with calcaneal squeeze right. Ankle ROM diminished range of motion right. Silfverskiold Test: positive right.   Radiographs: None  Assessment:    1. Plantar fasciitis of right foot   2. Foot deformity, bilateral     Plan:  Patient was evaluated and treated and all questions answered.  Right heel superficial blister/red patch - At this time I discussed with the patient there is no clinical concern for this.  I am not able to appreciate any red patches today.  Will continue to monitor if it regresses I discussed with her to keep an eye out on it she states understanding  Plantar Fasciitis, right - XR reviewed as above.  - Educated on icing and stretching. Instructions given.  - Second injection delivered to the plantar fascia as below. - DME: Plantar fascial brace dispensed to support the medial longitudinal arch of the foot and offload pressure from the heel and prevent arch collapse during weightbearing - Pharmacologic management: None   Pes cavus/foot deformity -I explained to patient the etiology of pes cavus/foot deformity and relationship with Planter fasciitis and various treatment options were discussed.  Given patient foot structure in the setting of Planter fasciitis I believe patient will benefit from custom-made orthotics to help control the hindfoot motion support the arch of the foot and take the stress away from plantar fascial.  Patient agrees with the plan like to proceed with orthotics -Patient was casted for orthotics   Procedure: Injection Tendon/Ligament Location: Right plantar fascia at the glabrous junction; medial approach. Skin Prep: alcohol Injectate: 0.5 cc 0.5% marcaine plain, 0.5 cc of 1% Lidocaine, 0.5 cc kenalog 10. Disposition: Patient tolerated procedure well. Injection site dressed with a band-aid.  No follow-ups on  file.  Right heel patch likely riction blister continue to monitor

## 2024-06-05 ENCOUNTER — Other Ambulatory Visit

## 2024-06-21 ENCOUNTER — Ambulatory Visit

## 2024-06-21 NOTE — Progress Notes (Signed)
Patient presents today to pick up custom molded foot orthotics, diagnosed with PF by Dr. Allena Katz.   Orthotics were dispensed and fit was satisfactory. Reviewed instructions for break-in and wear. Written instructions given to patient.  Patient will follow up as needed.   Addison Bailey Cped, CFo, CFm

## 2024-08-28 ENCOUNTER — Other Ambulatory Visit: Payer: Self-pay | Admitting: Family Medicine

## 2024-08-28 DIAGNOSIS — Z1231 Encounter for screening mammogram for malignant neoplasm of breast: Secondary | ICD-10-CM

## 2024-09-08 ENCOUNTER — Ambulatory Visit
Admission: RE | Admit: 2024-09-08 | Discharge: 2024-09-08 | Disposition: A | Source: Ambulatory Visit | Attending: Family Medicine | Admitting: Family Medicine

## 2024-09-08 DIAGNOSIS — Z1231 Encounter for screening mammogram for malignant neoplasm of breast: Secondary | ICD-10-CM
